# Patient Record
Sex: Male | Born: 2000 | Race: Black or African American | Hispanic: No | Marital: Single | Smoking: Never smoker
Health system: Southern US, Community
[De-identification: ages and names within clinical notes are randomized; demographics above are authoritative.]

## PROBLEM LIST (undated history)

## (undated) DIAGNOSIS — J45909 Unspecified asthma, uncomplicated: Secondary | ICD-10-CM

## (undated) DIAGNOSIS — E669 Obesity, unspecified: Secondary | ICD-10-CM

---
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
## 2018-04-25 ENCOUNTER — Other Ambulatory Visit: Payer: Self-pay

## 2018-04-25 ENCOUNTER — Encounter (HOSPITAL_BASED_OUTPATIENT_CLINIC_OR_DEPARTMENT_OTHER): Payer: Self-pay | Admitting: Adult Health

## 2018-04-25 ENCOUNTER — Emergency Department (HOSPITAL_BASED_OUTPATIENT_CLINIC_OR_DEPARTMENT_OTHER)
Admission: EM | Admit: 2018-04-25 | Discharge: 2018-04-25 | Disposition: A | Discharge status: Home / Self Care | Payer: Self-pay | Attending: Emergency Medicine | Admitting: Emergency Medicine

## 2018-04-25 DIAGNOSIS — M7918 Myalgia, other site: Secondary | ICD-10-CM | POA: Insufficient documentation

## 2018-04-25 DIAGNOSIS — Z91040 Latex allergy status: Secondary | ICD-10-CM | POA: Insufficient documentation

## 2018-04-25 MED ORDER — METHOCARBAMOL 500 MG PO TABS: 500.0000 mg | ORAL_TABLET | Freq: Two times a day (BID) | ORAL | 0 refills | Status: AC

## 2018-04-25 MED ORDER — NAPROXEN 375 MG PO TABS: 375.0000 mg | ORAL_TABLET | Freq: Two times a day (BID) | ORAL | 0 refills | Status: DC

## 2018-04-25 NOTE — ED Notes (Signed)
ED Provider at bedside. 
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
## 2018-09-13 ENCOUNTER — Other Ambulatory Visit: Payer: Self-pay

## 2018-09-13 ENCOUNTER — Emergency Department (HOSPITAL_COMMUNITY)
Admission: EM | Admit: 2018-09-13 | Discharge: 2018-09-13 | Disposition: A | Discharge status: Home / Self Care | Payer: Medicaid Other | Attending: Emergency Medicine | Admitting: Emergency Medicine

## 2018-09-13 ENCOUNTER — Emergency Department (HOSPITAL_COMMUNITY): Payer: Medicaid Other

## 2018-09-13 DIAGNOSIS — Z79899 Other long term (current) drug therapy: Secondary | ICD-10-CM | POA: Insufficient documentation

## 2018-09-13 DIAGNOSIS — Y9389 Activity, other specified: Secondary | ICD-10-CM | POA: Insufficient documentation

## 2018-09-13 DIAGNOSIS — Y990 Civilian activity done for income or pay: Secondary | ICD-10-CM | POA: Insufficient documentation

## 2018-09-13 DIAGNOSIS — S92501A Displaced unspecified fracture of right lesser toe(s), initial encounter for closed fracture: Secondary | ICD-10-CM | POA: Insufficient documentation

## 2018-09-13 DIAGNOSIS — Z91040 Latex allergy status: Secondary | ICD-10-CM | POA: Insufficient documentation

## 2018-09-13 DIAGNOSIS — W241XXA Contact with transmission devices, not elsewhere classified, initial encounter: Secondary | ICD-10-CM | POA: Insufficient documentation

## 2018-09-13 DIAGNOSIS — Y9289 Other specified places as the place of occurrence of the external cause: Secondary | ICD-10-CM | POA: Insufficient documentation

## 2018-09-13 DIAGNOSIS — S9031XA Contusion of right foot, initial encounter: Secondary | ICD-10-CM | POA: Insufficient documentation

## 2018-09-13 MED ORDER — OXYCODONE-ACETAMINOPHEN 5-325 MG PO TABS
1.0000 | ORAL_TABLET | Freq: Once | ORAL | Status: AC
Start: 2018-09-13 — End: 2018-09-13
  Administered 2018-09-13: 1 via ORAL
  Filled 2018-09-13: qty 1

## 2018-09-13 MED ORDER — NAPROXEN 500 MG PO TABS: 500.0000 mg | ORAL_TABLET | Freq: Two times a day (BID) | ORAL | 0 refills | Status: AC
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
## 2018-09-13 NOTE — ED Provider Notes (Signed)
[facility]-EMERGENCY DEPT Provider Note   CSN: [id number] Arrival date & time: 09/13/18  0431    History   Chief Complaint Chief Complaint  Patient presents with  . Foot Pain    HPI Jeremy Perez is a 18 y.o. male.     HPI  This is a 18 year old male who presents with a right foot injury.  Patient reports that he was at work when his right foot was run over by forklift.  He reports pain with ambulation.  He had difficulty ambulating.  Currently rates his pain 8 out of 10.  It is worse when he bears weight.  He denies any numbness or tingling.  Denies any significant medical problems.  He is not taking anything for his pain.  Denies other injury.  Past Medical History:  Diagnosis Date  . Obesity     There are no active problems to display for this patient.   No past surgical history on file.      Home Medications    Prior to Admission medications   Medication Sig Start Date End Date Taking? Authorizing Provider  methocarbamol (ROBAXIN) 500 MG tablet Take 1 tablet (500 mg total) by mouth 2 (two) times daily. 04/25/18   Harlene SaltsMorelli, Brandon A, PA-C  naproxen (NAPROSYN) 500 MG tablet Take 1 tablet (500 mg total) by mouth 2 (two) times daily. 09/13/18   Daquawn Seelman, Mayer Maskerourtney F, MD    Family History No family history on file.  Social History Social History   Tobacco Use  . Smoking status: Not on file  Substance Use Topics  . Alcohol use: Not on file  . Drug use: Not on file     Allergies   Latex and Shrimp [shellfish allergy]   Review of Systems Review of Systems  Musculoskeletal:       Right foot pain  Skin: Negative for wound.  Neurological: Negative for weakness and numbness.  All other systems reviewed and are negative.    Physical Exam Updated Vital Signs BP (!) 150/96   Pulse 67   Temp 98.5 F (36.9 C) (Oral)   Resp 16   Ht 1.626 m (5\' 4" )   Wt (!) 143.9 kg   SpO2 96%   BMI 54.45 kg/m   Physical Exam Vitals signs  and nursing note reviewed.  Constitutional:      Appearance: He is well-developed. He is obese.  HENT:     Head: Normocephalic and atraumatic.  Cardiovascular:     Rate and Rhythm: Normal rate and regular rhythm.  Pulmonary:     Effort: Pulmonary effort is normal. No respiratory distress.  Musculoskeletal:     Comments: Tenderness palpation right midfoot, no obvious deformity, no significant swelling, 2+ DP pulse, neurovascular intact distally  Skin:    General: Skin is warm and dry.  Neurological:     Mental Status: He is alert and oriented to person, place, and time.      ED Treatments / Results  Labs (all labs ordered are listed, but only abnormal results are displayed) Labs Reviewed - No data to display  EKG None  Radiology Dg Foot Complete Right  Result Date: 09/13/2018 CLINICAL DATA:  Metatarsal pain after work-related blunt injury. EXAM: RIGHT FOOT COMPLETE - 3+ VIEW COMPARISON:  None. FINDINGS: Nondisplaced fracture of the fourth middle phalanx extending to the DIP joint. History of metatarsal pain with no associated fracture or malalignment. IMPRESSION: Nondisplaced fracture of the fourth middle phalanx. Electronically Signed   By:  Marnee Spring M.D.   On: 09/13/2018 05:47    Procedures Procedures (including critical care time)  Medications Ordered in ED Medications  oxyCODONE-acetaminophen (PERCOCET/ROXICET) 5-325 MG per tablet 1 tablet (1 tablet Oral Given 09/13/18 0501)     Initial Impression / Assessment and Plan / ED Course  I have reviewed the triage vital signs and the nursing notes.  Pertinent labs & imaging results that were available during my care of the patient were reviewed by me and considered in my medical decision making (see chart for details).        Patient presents with an injury to the right foot.  He is overall nontoxic-appearing and vital signs are reassuring.  He is neurovascular intact.  X-rays are concerning for a right fourth  digit nondisplaced fracture.  Buddy tape was applied and patient was given a postop shoe.  He has no evidence of Lisfranc injury.  Likely contusion of the midfoot.  Ice and elevation.  Naproxen as needed for pain.  After history, exam, and medical workup I feel the patient has been appropriately medically screened and is safe for discharge home. Pertinent diagnoses were discussed with the patient. Patient was given return precautions.   Final Clinical Impressions(s) / ED Diagnoses   Final diagnoses:  Closed fracture of phalanx of right fourth toe, initial encounter  Contusion of right foot, initial encounter    ED Discharge Orders         Ordered    naproxen (NAPROSYN) 500 MG tablet  2 times daily     09/13/18 7342           Shon Baton, MD 09/13/18 [phone]

## 2018-09-13 NOTE — Discharge Instructions (Addendum)
Your seen today for injury to the right foot.  You have evidence of a toe fracture.  Keep buddy taped and use postop shoe.  This will likely heal well on its own.  You likely have a contusion of your midfoot as well.  Ice and elevate for pain control.  Take naproxen as needed.

## 2021-03-07 ENCOUNTER — Emergency Department (HOSPITAL_BASED_OUTPATIENT_CLINIC_OR_DEPARTMENT_OTHER)
Admission: EM | Admit: 2021-03-07 | Discharge: 2021-03-07 | Disposition: A | Discharge status: Home / Self Care | Payer: BC Managed Care – PPO | Attending: Emergency Medicine | Admitting: Emergency Medicine

## 2021-03-07 ENCOUNTER — Encounter (HOSPITAL_BASED_OUTPATIENT_CLINIC_OR_DEPARTMENT_OTHER): Payer: Self-pay | Admitting: *Deleted

## 2021-03-07 ENCOUNTER — Other Ambulatory Visit: Payer: Self-pay

## 2021-03-07 ENCOUNTER — Emergency Department (HOSPITAL_BASED_OUTPATIENT_CLINIC_OR_DEPARTMENT_OTHER): Payer: BC Managed Care – PPO

## 2021-03-07 DIAGNOSIS — M25561 Pain in right knee: Secondary | ICD-10-CM | POA: Diagnosis not present

## 2021-03-07 DIAGNOSIS — W010XXA Fall on same level from slipping, tripping and stumbling without subsequent striking against object, initial encounter: Secondary | ICD-10-CM | POA: Diagnosis not present

## 2021-03-07 DIAGNOSIS — Y990 Civilian activity done for income or pay: Secondary | ICD-10-CM | POA: Insufficient documentation

## 2021-03-07 DIAGNOSIS — S8991XA Unspecified injury of right lower leg, initial encounter: Secondary | ICD-10-CM | POA: Insufficient documentation

## 2021-03-07 MED ORDER — KETOROLAC TROMETHAMINE 30 MG/ML IJ SOLN
30.0000 mg | Freq: Once | INTRAMUSCULAR | Status: AC
  Administered 2021-03-07: 30 mg via INTRAMUSCULAR
  Filled 2021-03-07: qty 1

## 2021-03-07 MED ORDER — DICLOFENAC SODIUM 1 % EX GEL: 2.0000 g | Freq: Four times a day (QID) | CUTANEOUS | 1 refills | Status: AC

## 2021-03-07 NOTE — ED Provider Notes (Signed)
[facility] EMERGENCY DEPARTMENT Provider Note   CSN: [id number] Arrival date & time: 03/07/21  2029     History Chief Complaint  Patient presents with   Jeremy Perez is a 20 y.o. male.  With past medical history of obesity who presents emergency department with right knee pain.  He states that on Wednesday he was at work when he slipped on grease on the floor.  States that he initially began to fall backward, but then tried to catch himself and fell onto his right knee.  Denies feeling or hearing any pops or cracks.  He states that he has tried ibuprofen without relief of symptoms.  Walking increases the pain sitting or lying down decreases the pain.  He denies any redness or swelling to the area.  Denies fever.  Denies hitting his head or loss of consciousness.    Fall      Past Medical History:  Diagnosis Date   Obesity     There are no problems to display for this patient.   History reviewed. No pertinent surgical history.     No family history on file.  Social History   Substance Use Topics   Alcohol use: Not Currently   Drug use: Not Currently    Home Medications Prior to Admission medications   Medication Sig Start Date End Date Taking? Authorizing Provider  methocarbamol (ROBAXIN) 500 MG tablet Take 1 tablet (500 mg total) by mouth 2 (two) times daily. 04/25/18   Harlene Salts A, PA-C  naproxen (NAPROSYN) 500 MG tablet Take 1 tablet (500 mg total) by mouth 2 (two) times daily. 09/13/18   Horton, Mayer Masker, MD    Allergies    Latex and Shrimp [shellfish allergy]  Review of Systems   Review of Systems  Constitutional:  Negative for fever.  Musculoskeletal:  Positive for arthralgias.  All other systems reviewed and are negative.  Physical Exam Updated Vital Signs BP 138/72 (BP Location: Left Arm)   Pulse 94   Temp 98.3 F (36.8 C) (Oral)   Resp 18   Ht 5\' 6"  (1.676 m)   Wt (!) 145.2 kg   SpO2 98%   BMI 51.65  kg/m   Physical Exam Vitals and nursing note reviewed.  Constitutional:      General: He is not in acute distress.    Appearance: Normal appearance. He is not ill-appearing.  HENT:     Head: Normocephalic and atraumatic.  Eyes:     General: No scleral icterus. Pulmonary:     Effort: Pulmonary effort is normal. No respiratory distress.  Musculoskeletal:        General: Tenderness present. No swelling, deformity or signs of injury. Normal range of motion.     Right lower leg: No edema.     Left lower leg: No edema.  Skin:    General: Skin is warm and dry.     Capillary Refill: Capillary refill takes less than 2 seconds.     Findings: No bruising or rash.  Neurological:     General: No focal deficit present.     Mental Status: He is alert and oriented to person, place, and time. Mental status is at baseline.     Sensory: No sensory deficit.  Psychiatric:        Mood and Affect: Mood normal.        Behavior: Behavior normal.        Thought Content: Thought content normal.  Judgment: Judgment normal.    ED Results / Procedures / Treatments   Labs (all labs ordered are listed, but only abnormal results are displayed) Labs Reviewed - No data to display  EKG None  Radiology DG Knee Complete 4 Views Right  Result Date: 03/07/2021 CLINICAL DATA:  Fall. EXAM: RIGHT KNEE - COMPLETE 4+ VIEW COMPARISON:  None. FINDINGS: No evidence of fracture, dislocation, or joint effusion. No evidence of arthropathy or other focal bone abnormality. Soft tissues are unremarkable. IMPRESSION: Negative. Electronically Signed   By: Elgie Collard M.D.   On: 03/07/2021 22:28    Procedures Procedures   Medications Ordered in ED Medications  ketorolac (TORADOL) 30 MG/ML injection 30 mg (30 mg Intramuscular Given 03/07/21 2324)    ED Course  I have reviewed the triage vital signs and the nursing notes.  Pertinent labs & imaging results that were available during my care of the patient  were reviewed by me and considered in my medical decision making (see chart for details).    MDM Rules/Calculators/A&P 20 year old male who presents emergency department with knee pain.  X-ray of the right knee shows no fracture, dislocation, joint effusion.  On physical exam there is no appreciable swelling to the right knee.  He does not have pain of the right hip or ankle.  Able to move knee throughout range of motion with pain. Given IM Toradol for pain relief. Given knee immobilizer to use with ambulation. Prescribed Voltaren gel to use in addition to Tylenol and ibuprofen.  I have instructed him on RICE techniques for improving his symptoms. Given referral to sports medicine should he have ongoing symptoms. Patient is agreeable to plan and safe for discharge. Final Clinical Impression(s) / ED Diagnoses Final diagnoses:  Acute pain of right knee    Rx / DC Orders ED Discharge Orders          Ordered    diclofenac Sodium (VOLTAREN) 1 % GEL  4 times daily        03/07/21 2320             Cristopher Peru, PA-C 03/08/21 0129    Charlynne Pander, MD 03/08/21 1452

## 2021-03-07 NOTE — ED Triage Notes (Signed)
C/o mechanical fall at work x 2 days ago c/o right knee injury

## 2021-03-07 NOTE — Discharge Instructions (Signed)
You were seen in the emergency department today for pain to the right knee after you fell at work.  The x-rays here were negative for any fractures, dislocations, joint effusions.  We gave you a dose of pain medication while you are here in the emergency department.  Continue to use ibuprofen as needed along with ice for 15 to 20 minutes at the time, elevating the leg while you are resting.  I am also giving you a knee immobilizer to use while you are walking which may help with the pain.  I Jeremy Perez have you follow-up with sports medicine if you need to and I have provided that in your discharge paperwork.  Please return if you begin to have fevers or inability to walk.

## 2021-03-17 ENCOUNTER — Ambulatory Visit: Payer: Self-pay

## 2021-03-17 ENCOUNTER — Ambulatory Visit (INDEPENDENT_AMBULATORY_CARE_PROVIDER_SITE_OTHER): Payer: BC Managed Care – PPO | Admitting: Family Medicine

## 2021-03-17 VITALS — BP 146/84 | Ht 62.0 in | Wt 325.0 lb

## 2021-03-17 DIAGNOSIS — S8011XA Contusion of right lower leg, initial encounter: Secondary | ICD-10-CM | POA: Insufficient documentation

## 2021-03-17 DIAGNOSIS — S8001XA Contusion of right knee, initial encounter: Secondary | ICD-10-CM

## 2021-03-17 MED ORDER — PREDNISONE 5 MG PO TABS: ORAL_TABLET | ORAL | 0 refills | Status: AC

## 2021-03-17 NOTE — Assessment & Plan Note (Signed)
Initial injury occurred while at work.  Seems more of a bony contusion with reassuring imaging of the knee -Counseled on home exercise therapy and supportive care. -Prednisone. -Could consider physical therapy. -Provided work note

## 2021-03-17 NOTE — Patient Instructions (Signed)
Nice to meet you  Please try ice  Please try the medicine  Please try the exercises  Please send me a message in MyChart with any questions or updates.  Please see me back in 2 weeks.   --Dr. Jordan Likes

## 2021-03-17 NOTE — Progress Notes (Signed)
  Jeremy Perez - 20 y.o. male MRN [id number]  Date of birth: Nov 16, 2000  SUBJECTIVE:  Including CC & ROS.  No chief complaint on file.   Jeremy Perez is a 20 y.o. male that is presenting with acute right knee pain.  He fell onto his knee while at work.  Since that time he has been having anterior knee pain.  No previous history of any problems.  Independent review of the right knee x-ray from 11/18 shows no acute changes.   Review of Systems See HPI   HISTORY: Past Medical, Surgical, Social, and Family History Reviewed & Updated per EMR.   Pertinent Historical Findings include:  Past Medical History:  Diagnosis Date   Obesity     No past surgical history on file.  No family history on file.  Social History   Socioeconomic History   Marital status: Single    Spouse name: Not on file   Number of children: Not on file   Years of education: Not on file   Highest education level: Not on file  Occupational History   Not on file  Tobacco Use   Smoking status: Not on file   Smokeless tobacco: Not on file  Substance and Sexual Activity   Alcohol use: Not Currently   Drug use: Not Currently   Sexual activity: Not on file  Other Topics Concern   Not on file  Social History Narrative   Not on file   Social Determinants of Health   Financial Resource Strain: Not on file  Food Insecurity: Not on file  Transportation Needs: Not on file  Physical Activity: Not on file  Stress: Not on file  Social Connections: Not on file  Intimate Partner Violence: Not on file     PHYSICAL EXAM:  VS: BP (!) 146/84   Ht 5\' 2"  (1.575 m)   Wt (!) 325 lb (147.4 kg)   BMI 59.44 kg/m  Physical Exam Gen: NAD, alert, cooperative with exam, well-appearing   Limited ultrasound: Right knee:  No effusion. Normal-appearing quadricep patellar tendon. Normal medial and lateral joint space. Mild increased hyperemia at the proximal tibia to suggest a contusion  Summary: Tibial  contusion  Ultrasound and interpretation by , MD     ASSESSMENT & PLAN:   Contusion of right tibia Initial injury occurred while at work.  Seems more of a bony contusion with reassuring imaging of the knee -Counseled on home exercise therapy and supportive care. -Prednisone. -Could consider physical therapy. -Provided work note

## 2021-04-04 ENCOUNTER — Encounter: Payer: Self-pay | Admitting: Family Medicine

## 2021-04-04 ENCOUNTER — Ambulatory Visit (INDEPENDENT_AMBULATORY_CARE_PROVIDER_SITE_OTHER): Payer: BC Managed Care – PPO | Admitting: Family Medicine

## 2021-04-04 VITALS — BP 138/82 | Ht 62.0 in | Wt 325.0 lb

## 2021-04-04 DIAGNOSIS — S8011XA Contusion of right lower leg, initial encounter: Secondary | ICD-10-CM | POA: Diagnosis not present

## 2021-04-04 NOTE — Patient Instructions (Signed)
Good to see you Please use ice as needed  You can use the brace if it helps   Please send me a message in MyChart with any questions or updates.  Please see me back in 4 weeks.   --Dr. Jordan Likes

## 2021-04-04 NOTE — Progress Notes (Signed)
°  CUINN WESTERHOLD - 20 y.o. male MRN [id number]  Date of birth: 21-Jul-2000  SUBJECTIVE:  Including CC & ROS.  No chief complaint on file.   WINDELL MUSSON is a 20 y.o. male that is following up for his knee pain.  He was able to work if he was sitting down.  The pain has gotten some improvement but is still appreciated.   Review of Systems See HPI   HISTORY: Past Medical, Surgical, Social, and Family History Reviewed & Updated per EMR.   Pertinent Historical Findings include:  Past Medical History:  Diagnosis Date   Obesity     History reviewed. No pertinent surgical history.  History reviewed. No pertinent family history.  Social History   Socioeconomic History   Marital status: Single    Spouse name: Not on file   Number of children: Not on file   Years of education: Not on file   Highest education level: Not on file  Occupational History   Not on file  Tobacco Use   Smoking status: Not on file   Smokeless tobacco: Not on file  Substance and Sexual Activity   Alcohol use: Not Currently   Drug use: Not Currently   Sexual activity: Not on file  Other Topics Concern   Not on file  Social History Narrative   Not on file   Social Determinants of Health   Financial Resource Strain: Not on file  Food Insecurity: Not on file  Transportation Needs: Not on file  Physical Activity: Not on file  Stress: Not on file  Social Connections: Not on file  Intimate Partner Violence: Not on file     PHYSICAL EXAM:  VS: BP 138/82 (BP Location: Left Arm, Patient Position: Sitting)    Ht 5\' 2"  (1.575 m)    Wt (!) 325 lb (147.4 kg)    BMI 59.44 kg/m  Physical Exam Gen: NAD, alert, cooperative with exam, well-appearing    ASSESSMENT & PLAN:   Contusion of right tibia Injury occurred at work.  Pain is still occurring if weightbearing. -Counseled on home exercise therapy and supportive care. -Provided work note. -Could consider further imaging.

## 2021-04-04 NOTE — Assessment & Plan Note (Signed)
Injury occurred at work.  Pain is still occurring if weightbearing. -Counseled on home exercise therapy and supportive care. -Provided work note. -Could consider further imaging.

## 2021-05-08 ENCOUNTER — Ambulatory Visit (INDEPENDENT_AMBULATORY_CARE_PROVIDER_SITE_OTHER): Payer: BC Managed Care – PPO | Admitting: Family Medicine

## 2021-05-08 ENCOUNTER — Encounter: Payer: Self-pay | Admitting: Family Medicine

## 2021-05-08 VITALS — BP 128/78 | Ht 62.0 in | Wt 325.0 lb

## 2021-05-08 DIAGNOSIS — S8011XA Contusion of right lower leg, initial encounter: Secondary | ICD-10-CM

## 2021-05-08 NOTE — Patient Instructions (Signed)
Good to see you °Please continue physical therapy   °Please send me a message in MyChart with any questions or updates.  °Please see me back in 4 weeks.  ° °--Dr. Garv Kuechle ° °

## 2021-05-08 NOTE — Assessment & Plan Note (Signed)
Significantly improved with physical therapy.  Has minor pain intermittently. -Counseled on home exercise therapy and supportive care. -Continue physical therapy. -Provided work note. -Could consider further imaging.

## 2021-05-08 NOTE — Progress Notes (Signed)
°  Jeremy Perez - 21 y.o. male MRN [id number]  Date of birth: 2000-07-31  SUBJECTIVE:  Including CC & ROS.  No chief complaint on file.   Jeremy Perez is a 21 y.o. male that is  following up for his right knee pain. Has been doing well with physical therapy. Pain is almost gone.   Review of Systems See HPI   HISTORY: Past Medical, Surgical, Social, and Family History Reviewed & Updated per EMR.   Pertinent Historical Findings include:  Past Medical History:  Diagnosis Date   Obesity     History reviewed. No pertinent surgical history.   PHYSICAL EXAM:  VS: BP 128/78 (BP Location: Left Arm, Patient Position: Sitting)    Ht 5\' 2"  (1.575 m)    Wt (!) 325 lb (147.4 kg)    BMI 59.44 kg/m  Physical Exam Gen: NAD, alert, cooperative with exam, well-appearing MSK:  Neurovascularly intact       ASSESSMENT & PLAN:   Contusion of right tibia Significantly improved with physical therapy.  Has minor pain intermittently. -Counseled on home exercise therapy and supportive care. -Continue physical therapy. -Provided work note. -Could consider further imaging.

## 2021-06-12 ENCOUNTER — Ambulatory Visit (INDEPENDENT_AMBULATORY_CARE_PROVIDER_SITE_OTHER): Payer: BC Managed Care – PPO | Admitting: Family Medicine

## 2021-06-12 ENCOUNTER — Encounter: Payer: Self-pay | Admitting: Family Medicine

## 2021-06-12 VITALS — BP 126/82 | Ht 62.0 in | Wt 325.0 lb

## 2021-06-12 DIAGNOSIS — S8011XA Contusion of right lower leg, initial encounter: Secondary | ICD-10-CM

## 2021-06-12 NOTE — Assessment & Plan Note (Signed)
Significantly improved. Has done well with physical therapy.  - counseled on home exercise therapy and supportive care - provided work note  - f/u as needed

## 2021-06-12 NOTE — Progress Notes (Signed)
°  Jeremy Perez - 21 y.o. male MRN [id number]  Date of birth: 2000/08/10  SUBJECTIVE:  Including CC & ROS.  No chief complaint on file.   Jeremy Perez is a 21 y.o. male that is  following up for his tibial pain. He's done well with physical therapy and denies pain today.    Review of Systems See HPI   HISTORY: Past Medical, Surgical, Social, and Family History Reviewed & Updated per EMR.   Pertinent Historical Findings include:  Past Medical History:  Diagnosis Date   Obesity     History reviewed. No pertinent surgical history.   PHYSICAL EXAM:  VS: BP 126/82 (BP Location: Left Arm, Patient Position: Sitting)    Ht 5\' 2"  (1.575 m)    Wt (!) 325 lb (147.4 kg)    BMI 59.44 kg/m  Physical Exam Gen: NAD, alert, cooperative with exam, well-appearing MSK:  Neurovascularly intact       ASSESSMENT & PLAN:   Contusion of right tibia Significantly improved. Has done well with physical therapy.  - counseled on home exercise therapy and supportive care - provided work note  - f/u as needed

## 2021-06-12 NOTE — Patient Instructions (Signed)
Good to see you Have fun on your trip  Please finish out physical therapy   Please send me a message in MyChart with any questions or updates.  Please see me back as needed.   --Dr. Jordan Likes

## 2022-01-04 ENCOUNTER — Encounter (HOSPITAL_BASED_OUTPATIENT_CLINIC_OR_DEPARTMENT_OTHER): Payer: Self-pay | Admitting: Emergency Medicine

## 2022-01-04 ENCOUNTER — Emergency Department (HOSPITAL_BASED_OUTPATIENT_CLINIC_OR_DEPARTMENT_OTHER)
Admission: EM | Admit: 2022-01-04 | Discharge: 2022-01-04 | Disposition: A | Discharge status: Home / Self Care | Payer: BC Managed Care – PPO | Attending: Emergency Medicine | Admitting: Emergency Medicine

## 2022-01-04 ENCOUNTER — Emergency Department (HOSPITAL_BASED_OUTPATIENT_CLINIC_OR_DEPARTMENT_OTHER): Payer: BC Managed Care – PPO

## 2022-01-04 ENCOUNTER — Other Ambulatory Visit: Payer: Self-pay

## 2022-01-04 DIAGNOSIS — M79671 Pain in right foot: Secondary | ICD-10-CM | POA: Diagnosis present

## 2022-01-04 NOTE — ED Notes (Signed)
D/c paperwork reviewed with pt, including f/u care. No questions or concerns at time of d/c. Pt wheeled to ED exit by friend, NAD.

## 2022-01-04 NOTE — ED Provider Notes (Addendum)
[facility] EMERGENCY DEPARTMENT Provider Note   CSN: [id number] Arrival date & time: 01/04/22  2128     History  Chief Complaint  Patient presents with   Foot Pain    Jeremy Perez is a 21 y.o. male.  Patient with complaint of foot pain on the sole of the foot about mid way.  No fall or injury no history of foreign body.  Never had trouble with it before.  Its been painful for about 2 days.  No ankle pain no leg pain no thigh pain.  Past medical history significant for obesity.  Patient nontobacco user.       Home Medications Prior to Admission medications   Medication Sig Start Date End Date Taking? Authorizing Provider  diclofenac Sodium (VOLTAREN) 1 % GEL Apply 2 g topically 4 (four) times daily. 03/07/21   Cristopher Peru, PA-C  methocarbamol (ROBAXIN) 500 MG tablet Take 1 tablet (500 mg total) by mouth 2 (two) times daily. 04/25/18   Harlene Salts A, PA-C  naproxen (NAPROSYN) 500 MG tablet Take 1 tablet (500 mg total) by mouth 2 (two) times daily. 09/13/18   Horton, Mayer Masker, MD  predniSONE (DELTASONE) 5 MG tablet Take 6 pills for first day, 5 pills second day, 4 pills third day, 3 pills fourth day, 2 pills the fifth day, and 1 pill sixth day. 03/17/21   Myra Rude, MD      Allergies    Latex and Shrimp [shellfish allergy]    Review of Systems   Review of Systems  Constitutional:  Negative for chills and fever.  HENT:  Negative for ear pain and sore throat.   Eyes:  Negative for pain and visual disturbance.  Respiratory:  Negative for cough and shortness of breath.   Cardiovascular:  Negative for chest pain and palpitations.  Gastrointestinal:  Negative for abdominal pain and vomiting.  Genitourinary:  Negative for dysuria and hematuria.  Musculoskeletal:  Negative for arthralgias and back pain.  Skin:  Negative for color change and rash.  Neurological:  Negative for seizures and syncope.  All other systems reviewed and are  negative.   Physical Exam Updated Vital Signs BP 135/75 (BP Location: Right Arm)   Pulse 84   Temp 98.5 F (36.9 C) (Oral)   Resp 18   Ht 1.676 m (5\' 6" )   Wt (!) 138.8 kg   SpO2 98%   BMI 49.39 kg/m  Physical Exam Vitals and nursing note reviewed.  Constitutional:      General: He is not in acute distress.    Appearance: Normal appearance. He is well-developed. He is obese.  HENT:     Head: Normocephalic and atraumatic.  Eyes:     Extraocular Movements: Extraocular movements intact.     Conjunctiva/sclera: Conjunctivae normal.     Pupils: Pupils are equal, round, and reactive to light.  Cardiovascular:     Rate and Rhythm: Normal rate and regular rhythm.     Heart sounds: No murmur heard. Pulmonary:     Effort: Pulmonary effort is normal. No respiratory distress.     Breath sounds: Normal breath sounds.  Abdominal:     Palpations: Abdomen is soft.     Tenderness: There is no abdominal tenderness.  Musculoskeletal:        General: Tenderness present. No swelling.     Cervical back: Normal range of motion and neck supple.     Comments: No evidence of any plantar wart or wound or  significant swelling to the bottom of the right foot.  Heavy callus on the bottom of both feet.  Tender about midportion on the sole.  Dorsalis pedis pulses 2+.  Good cap refill distally.  Good movement of the toes.  No ankle tenderness no leg tenderness no knee tenderness no thigh tenderness.  Skin:    General: Skin is warm and dry.     Capillary Refill: Capillary refill takes less than 2 seconds.  Neurological:     General: No focal deficit present.     Mental Status: He is alert and oriented to person, place, and time.  Psychiatric:        Mood and Affect: Mood normal.     ED Results / Procedures / Treatments   Labs (all labs ordered are listed, but only abnormal results are displayed) Labs Reviewed - No data to display  EKG None  Radiology No results  found.  Procedures Procedures    Medications Ordered in ED Medications - No data to display  ED Course/ Medical Decision Making/ A&P                           Medical Decision Making Amount and/or Complexity of Data Reviewed Radiology: ordered.   Lamination of the right foot without any obvious abnormality.  It is possible it could be a plantar fasciitis.  We will get x-ray.  If negative we will have him treat with anti-inflammatories.  Patient does not have a primary care doctor.  X-ray of the right foot without any abnormalities.  Will treat symptomatically and given referral to podiatry.   Final Clinical Impression(s) / ED Diagnoses Final diagnoses:  Foot pain, right    Rx / DC Orders ED Discharge Orders     None         Fredia Sorrow, MD 01/04/22 8366    Fredia Sorrow, MD 01/04/22 2234

## 2022-01-04 NOTE — Discharge Instructions (Signed)
Schedule appointment to follow-up with podiatry.  Information provided above.  Work note provided to rest your foot.  Recommend taking medications like Motrin Advil Aleve or Naprosyn.

## 2022-01-04 NOTE — ED Triage Notes (Signed)
Patient c/o pain to the bottom of his right foot for the last 2 days. States he feels like there is a knot on the bottom of his foot.

## 2022-08-04 ENCOUNTER — Encounter: Payer: Self-pay | Admitting: *Deleted

## 2023-02-28 IMAGING — DX DG KNEE COMPLETE 4+V*R*
4 series · 4 of 4 positions shown · non-contrast
Comparison: None.

CLINICAL DATA: Fall.

EXAM:
RIGHT KNEE - COMPLETE 4+ VIEW

[knee ap]
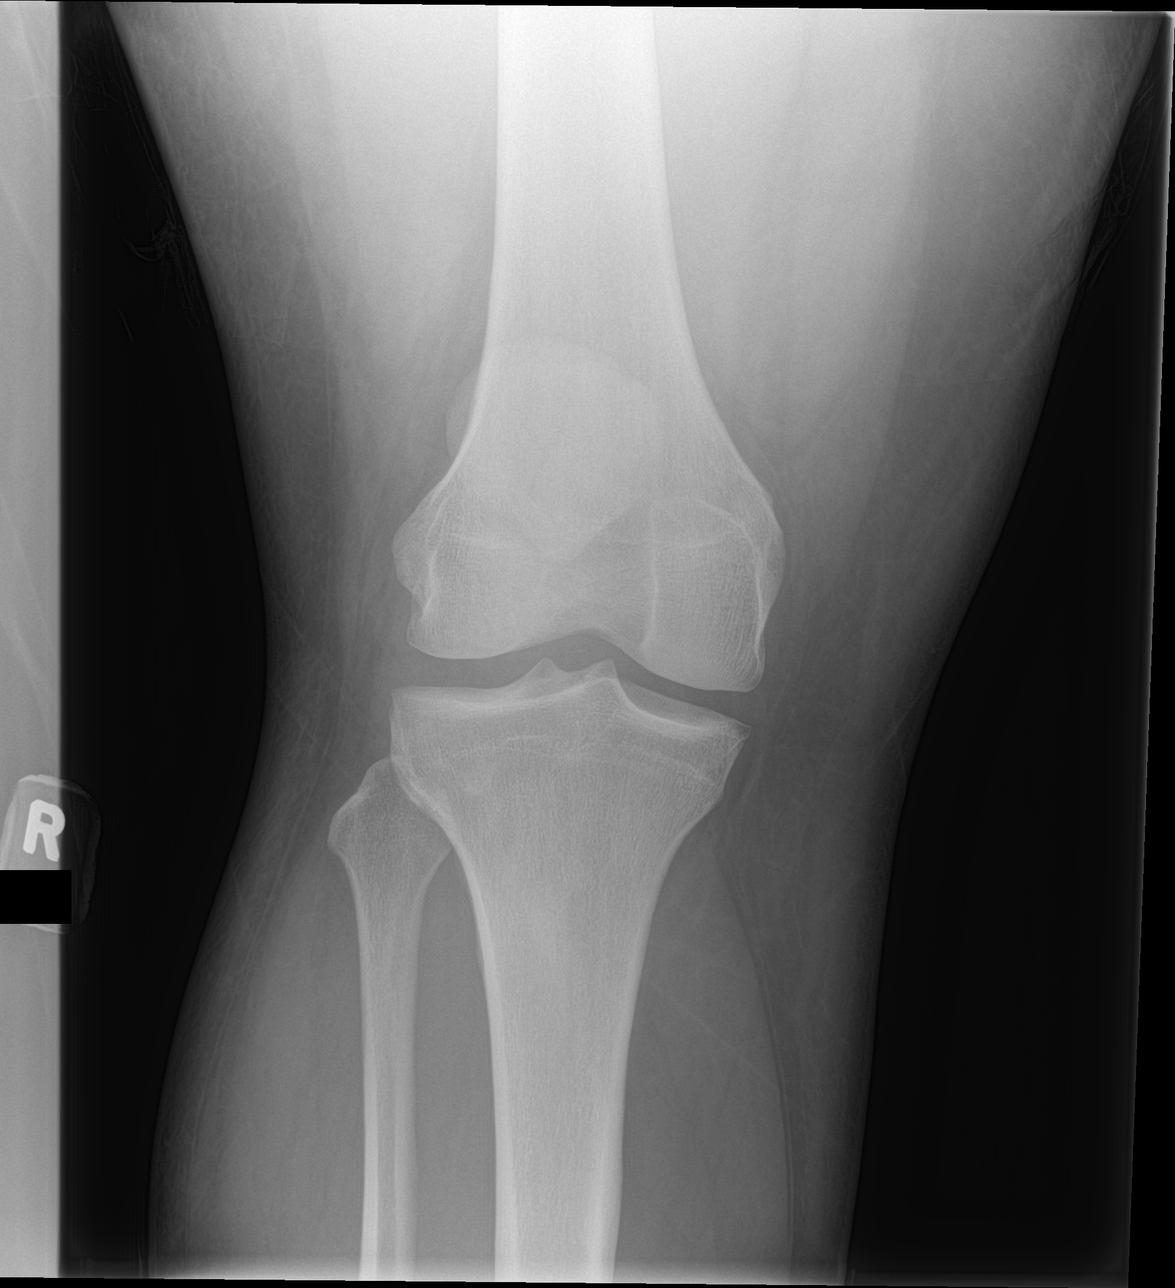

[knee lat]
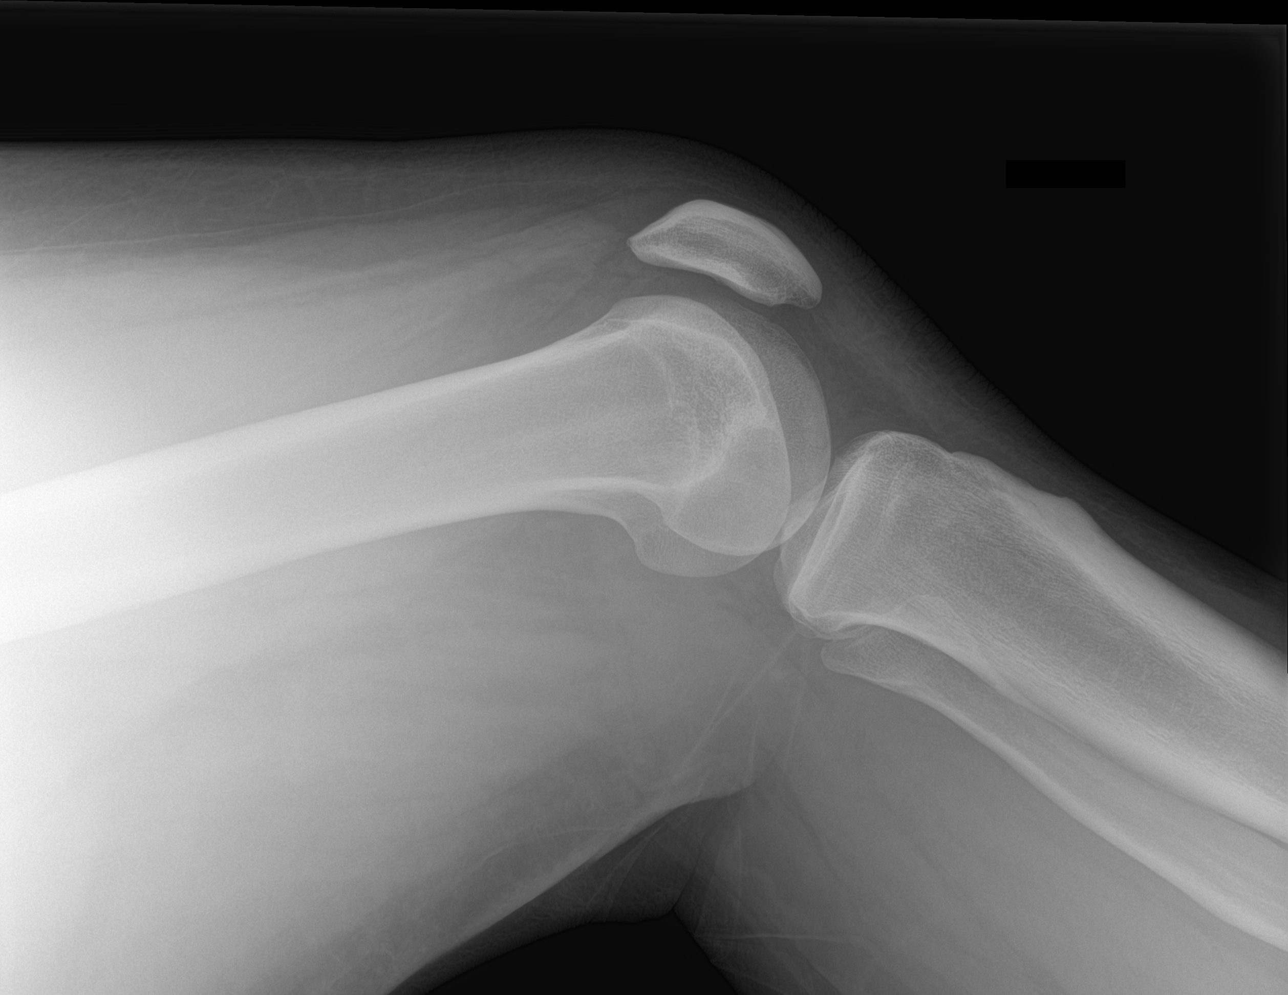

[knee obl (1 of 2)]
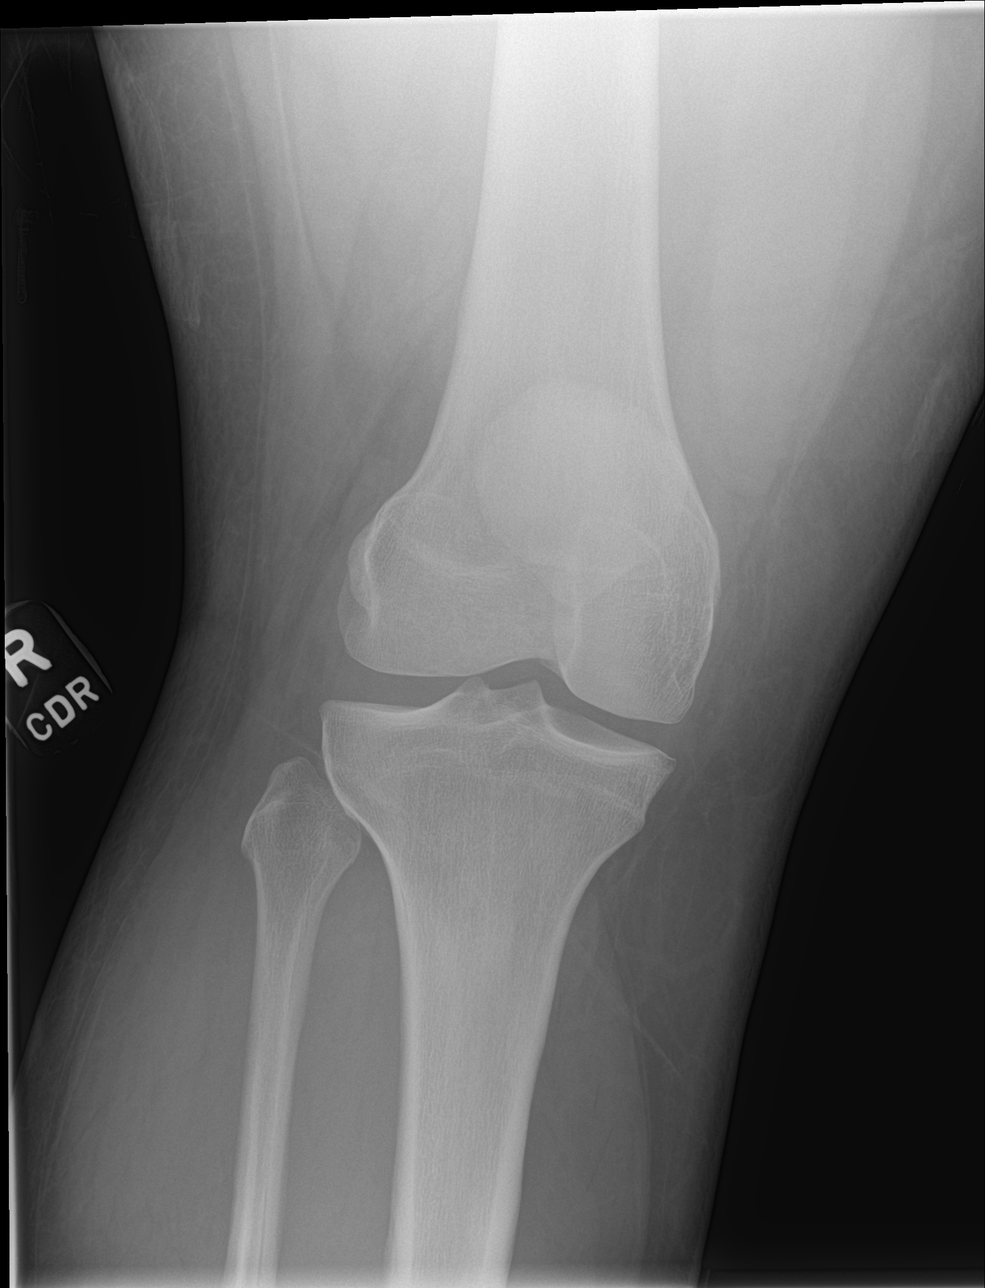

[knee obl (2 of 2)]
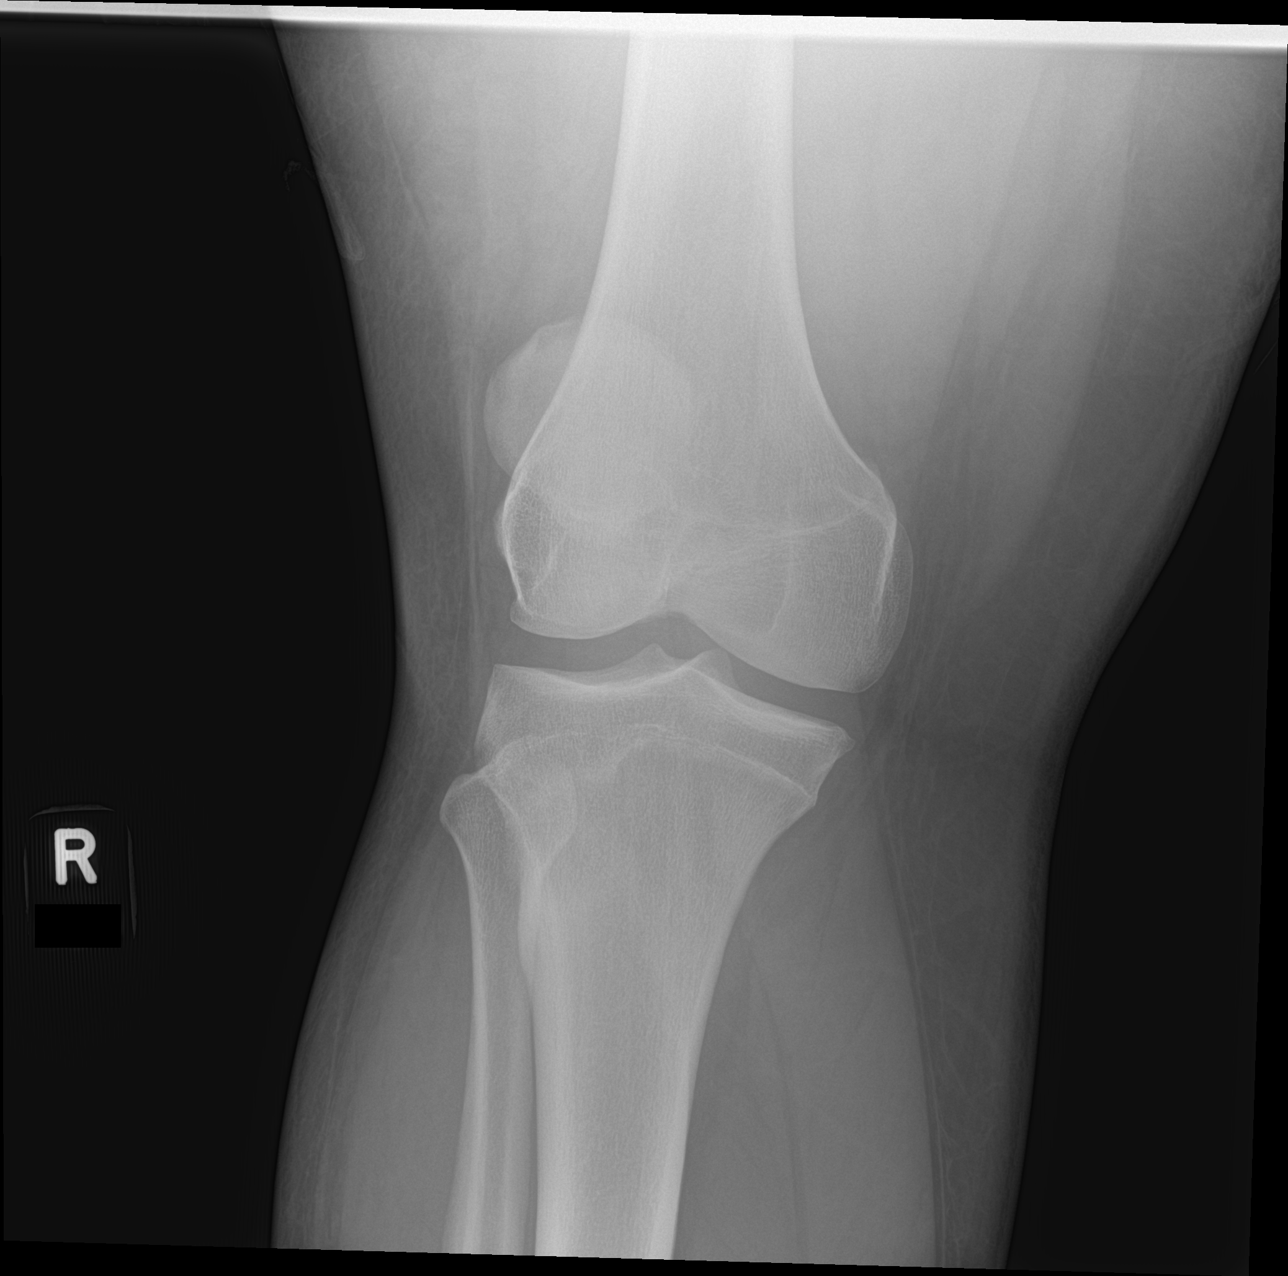

[4 of 4 positions shown; findings below may reference images not displayed]

FINDINGS: No evidence of fracture, dislocation, or joint effusion. No evidence
of arthropathy or other focal bone abnormality. Soft tissues are
unremarkable.
IMPRESSION: Negative.

## 2023-04-11 ENCOUNTER — Emergency Department (HOSPITAL_BASED_OUTPATIENT_CLINIC_OR_DEPARTMENT_OTHER)
Admission: EM | Admit: 2023-04-11 | Discharge: 2023-04-11 | Disposition: A | Discharge status: Home / Self Care | Payer: Self-pay | Attending: Emergency Medicine | Admitting: Emergency Medicine

## 2023-04-11 ENCOUNTER — Other Ambulatory Visit: Payer: Self-pay

## 2023-04-11 ENCOUNTER — Encounter (HOSPITAL_BASED_OUTPATIENT_CLINIC_OR_DEPARTMENT_OTHER): Payer: Self-pay

## 2023-04-11 DIAGNOSIS — Z91040 Latex allergy status: Secondary | ICD-10-CM | POA: Insufficient documentation

## 2023-04-11 DIAGNOSIS — J45909 Unspecified asthma, uncomplicated: Secondary | ICD-10-CM | POA: Insufficient documentation

## 2023-04-11 DIAGNOSIS — H66002 Acute suppurative otitis media without spontaneous rupture of ear drum, left ear: Secondary | ICD-10-CM | POA: Insufficient documentation

## 2023-04-11 HISTORY — DX: Unspecified asthma, uncomplicated: J45.909

## 2023-04-11 MED ORDER — AMOXICILLIN 875 MG PO TABS: 875.0000 mg | ORAL_TABLET | Freq: Two times a day (BID) | ORAL | 0 refills | Status: AC

## 2023-04-11 MED ORDER — AMOXICILLIN 500 MG PO CAPS
1000.0000 mg | ORAL_CAPSULE | Freq: Once | ORAL | Status: AC
  Administered 2023-04-11: 1000 mg via ORAL
  Filled 2023-04-11: qty 2

## 2023-04-11 NOTE — ED Triage Notes (Signed)
States can't hear out of left ear, sounds muffled. Denies ear pain. Started today.

## 2023-04-11 NOTE — Discharge Instructions (Addendum)
Thank you for allowing [place] to be a part of your care today.  You were evaluated in the ED for decreased hearing in your left ear.  Your exam is consistent with otitis media.  This causes fluid to build up behind the eardrum.  He received your first dose of antibiotic while you are in the ED.  Begin taking the prescribed amoxicillin beginning tomorrow morning and take for the next 6 days.  Finish the entire prescription even if your symptoms begin to improve.  Return to the ED if develop sudden worsening of your symptoms.  Follow-up with your primary care doctor if you have persistent symptoms.

## 2023-04-11 NOTE — ED Provider Notes (Signed)
[facility] Provider Note   CSN: [id number] Arrival date & time: 04/11/23  1823     History  Chief Complaint  Patient presents with   Hearing Problem    Jeremy Perez is a 22 y.o. male with past medical history significant for asthma, obesity presents to the ED complaining that he has decreased hearing out of his left ear.  He reports that everything sounds muffled.  He states that sounds are muffled when someone is talking to him close by, but he is unable to hear if someone is far away.  Symptoms began today.  He reports he did recently have cold-like symptoms.  Denies ear pain, fever, chills, congestion, ear discharge.      Home Medications Prior to Admission medications   Medication Sig Start Date End Date Taking? Authorizing Provider  amoxicillin (AMOXIL) 875 MG tablet Take 1 tablet (875 mg total) by mouth 2 (two) times daily for 6 days. 04/11/23 04/17/23 Yes Ennis Delpozo R, PA-C  diclofenac Sodium (VOLTAREN) 1 % GEL Apply 2 g topically 4 (four) times daily. 03/07/21   Cristopher Peru, PA-C  methocarbamol (ROBAXIN) 500 MG tablet Take 1 tablet (500 mg total) by mouth 2 (two) times daily. 04/25/18   Harlene Salts A, PA-C  naproxen (NAPROSYN) 500 MG tablet Take 1 tablet (500 mg total) by mouth 2 (two) times daily. 09/13/18   Horton, Mayer Masker, MD  predniSONE (DELTASONE) 5 MG tablet Take 6 pills for first day, 5 pills second day, 4 pills third day, 3 pills fourth day, 2 pills the fifth day, and 1 pill sixth day. 03/17/21   Myra Rude, MD      Allergies    Latex and Shrimp [shellfish allergy]    Review of Systems   Review of Systems  Constitutional:  Negative for chills and fever.  HENT:  Positive for hearing loss (muffled hearing L ear). Negative for congestion, ear discharge and ear pain.     Physical Exam Updated Vital Signs BP (!) 174/81 (BP Location: Left Arm)   Pulse 99   Temp 99 F (37.2 C) (Oral)    Resp 16   Ht 5\' 6"  (1.676 m)   Wt (!) 141.5 kg   SpO2 97%   BMI 50.36 kg/m  Physical Exam Vitals and nursing note reviewed.  Constitutional:      General: He is not in acute distress.    Appearance: Normal appearance. He is not ill-appearing or diaphoretic.  HENT:     Right Ear: Ear canal normal. No tenderness. A middle ear effusion (serous) is present. There is no impacted cerumen. No foreign body. Tympanic membrane is not perforated, erythematous or bulging.     Left Ear: Ear canal normal. No tenderness. A middle ear effusion (suppurative) is present. There is no impacted cerumen. No foreign body. Tympanic membrane is erythematous and bulging. Tympanic membrane is not perforated.     Nose: No congestion or rhinorrhea.     Mouth/Throat:     Lips: Pink.     Mouth: Mucous membranes are moist.  Cardiovascular:     Rate and Rhythm: Normal rate and regular rhythm.  Pulmonary:     Effort: Pulmonary effort is normal.  Skin:    General: Skin is warm and dry.     Capillary Refill: Capillary refill takes less than 2 seconds.  Neurological:     Mental Status: He is alert. Mental status is at baseline.  Psychiatric:  Mood and Affect: Mood normal.        Behavior: Behavior normal.     ED Results / Procedures / Treatments   Labs (all labs ordered are listed, but only abnormal results are displayed) Labs Reviewed - No data to display  EKG None  Radiology No results found.  Procedures Procedures    Medications Ordered in ED Medications  amoxicillin (AMOXIL) capsule 1,000 mg (has no administration in time range)    ED Course/ Medical Decision Making/ A&P                                 Medical Decision Making Risk Prescription drug management.   This patient presents to the ED with chief complaint(s) of decreased hearing in the left ear.  The complaint involves an extensive differential diagnosis and also carries with it a high risk of complications and morbidity.     The differential diagnosis includes impacted cerumen, foreign body in the ear, otitis media  Initial Assessment:   Exam significant for overall well-appearing patient who is not in acute distress.  Right ear with serous middle ear effusion, no bulging or erythema to the TM.  No impacted cerumen in bilateral EACs.  Left TM is erythematous and bulging.  There is a suppurative middle ear effusion on this side.  Patient does have mildly decreased subjective hearing.  No perforation of the TM.  No drainage within the ear canal.  Treatment and Reassessment: Patient received first dose of amoxicillin while in the ED.  Disposition:   Patient has evidence of suppurative otitis media on the left side.  This is likely explaining his muffled hearing.  Will treat patient with amoxicillin.  The patient has been appropriately medically screened and/or stabilized in the ED. I have low suspicion for any other emergent medical condition which would require further screening, evaluation or treatment in the ED or require inpatient management. At time of discharge the patient is hemodynamically stable and in no acute distress. I have discussed work-up results and diagnosis with patient and answered all questions. Patient is agreeable with discharge plan. We discussed strict return precautions for returning to the emergency department and they verbalized understanding.             Final Clinical Impression(s) / ED Diagnoses Final diagnoses:  Non-recurrent acute suppurative otitis media of left ear without spontaneous rupture of tympanic membrane    Rx / DC Orders ED Discharge Orders          Ordered    amoxicillin (AMOXIL) 875 MG tablet  2 times daily        04/11/23 [address] [place] 04/11/23 1956    Alvira Monday, MD 04/12/23 1232
